# Patient Record
Sex: Male | Born: 1985 | Race: Black or African American | Hispanic: No | Marital: Single | Smoking: Never smoker
Health system: Southern US, Community
[De-identification: ages and names within clinical notes are randomized; demographics above are authoritative.]

---

## 2006-05-26 ENCOUNTER — Emergency Department (HOSPITAL_COMMUNITY): Admission: EM | Admit: 2006-05-26 | Discharge: 2006-05-26 | Payer: Self-pay | Admitting: Emergency Medicine

## 2006-06-08 ENCOUNTER — Emergency Department (HOSPITAL_COMMUNITY): Admission: EM | Admit: 2006-06-08 | Discharge: 2006-06-08 | Payer: Self-pay | Admitting: Emergency Medicine

## 2006-07-19 ENCOUNTER — Emergency Department (HOSPITAL_COMMUNITY): Admission: EM | Admit: 2006-07-19 | Discharge: 2006-07-19 | Payer: Self-pay | Admitting: Emergency Medicine

## 2006-07-21 ENCOUNTER — Inpatient Hospital Stay (HOSPITAL_COMMUNITY): Admission: EM | Admit: 2006-07-21 | Discharge: 2006-07-26 | Payer: Self-pay | Admitting: Emergency Medicine

## 2006-08-04 ENCOUNTER — Encounter: Admission: RE | Admit: 2006-08-04 | Discharge: 2006-08-04 | Payer: Self-pay | Admitting: Neurosurgery

## 2007-01-03 ENCOUNTER — Encounter: Admission: RE | Admit: 2007-01-03 | Discharge: 2007-01-03 | Payer: Self-pay | Admitting: Neurosurgery

## 2007-01-09 ENCOUNTER — Encounter: Admission: RE | Admit: 2007-01-09 | Discharge: 2007-01-09 | Payer: Self-pay | Admitting: Neurosurgery

## 2007-04-19 ENCOUNTER — Emergency Department (HOSPITAL_COMMUNITY): Admission: EM | Admit: 2007-04-19 | Discharge: 2007-04-19 | Payer: Self-pay | Admitting: Emergency Medicine

## 2008-04-10 ENCOUNTER — Emergency Department (HOSPITAL_COMMUNITY): Admission: EM | Admit: 2008-04-10 | Discharge: 2008-04-11 | Payer: Self-pay | Admitting: Emergency Medicine

## 2008-09-27 ENCOUNTER — Emergency Department (HOSPITAL_COMMUNITY): Admission: EM | Admit: 2008-09-27 | Discharge: 2008-09-27 | Payer: Self-pay | Admitting: Emergency Medicine

## 2009-02-15 IMAGING — CR DG ANKLE COMPLETE 3+V*R*
3 series · 3 of 3 positions shown · non-contrast
Comparison: none

CLINICAL DATA: Right ankle pain and swelling.  Injured right ankle running 2 weeks ago.  Swollen ankle. 
 RIGHT ANKLE - 3 VIEW:
 Mild soft tissue swelling.  No fracture or subluxation.

[t ankle joint ap right]
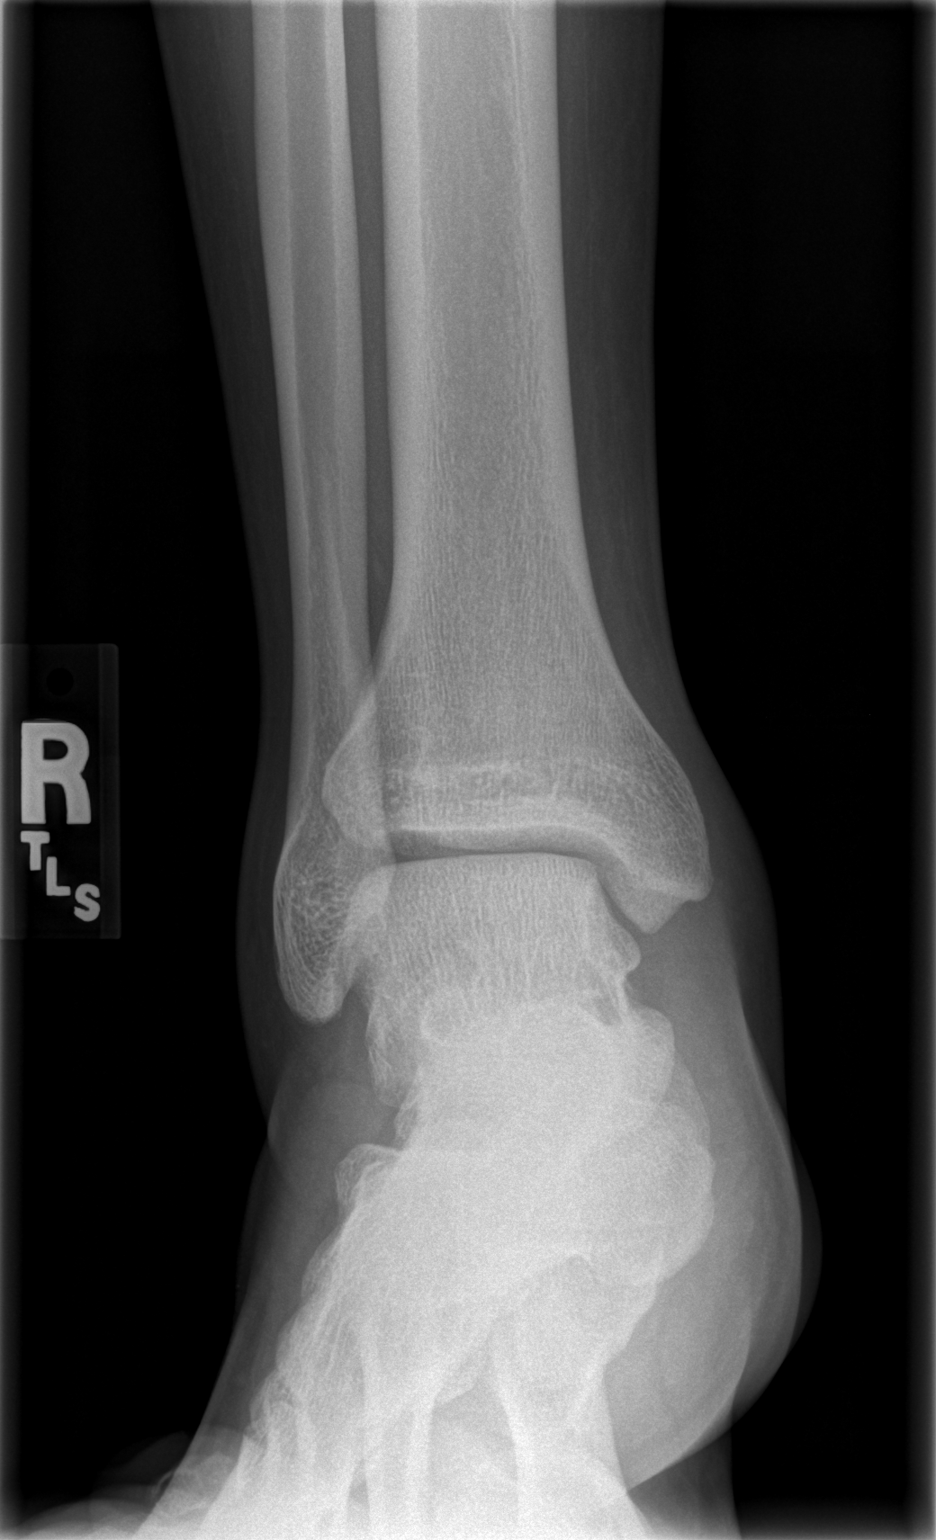

[t ankle joint oblique right]
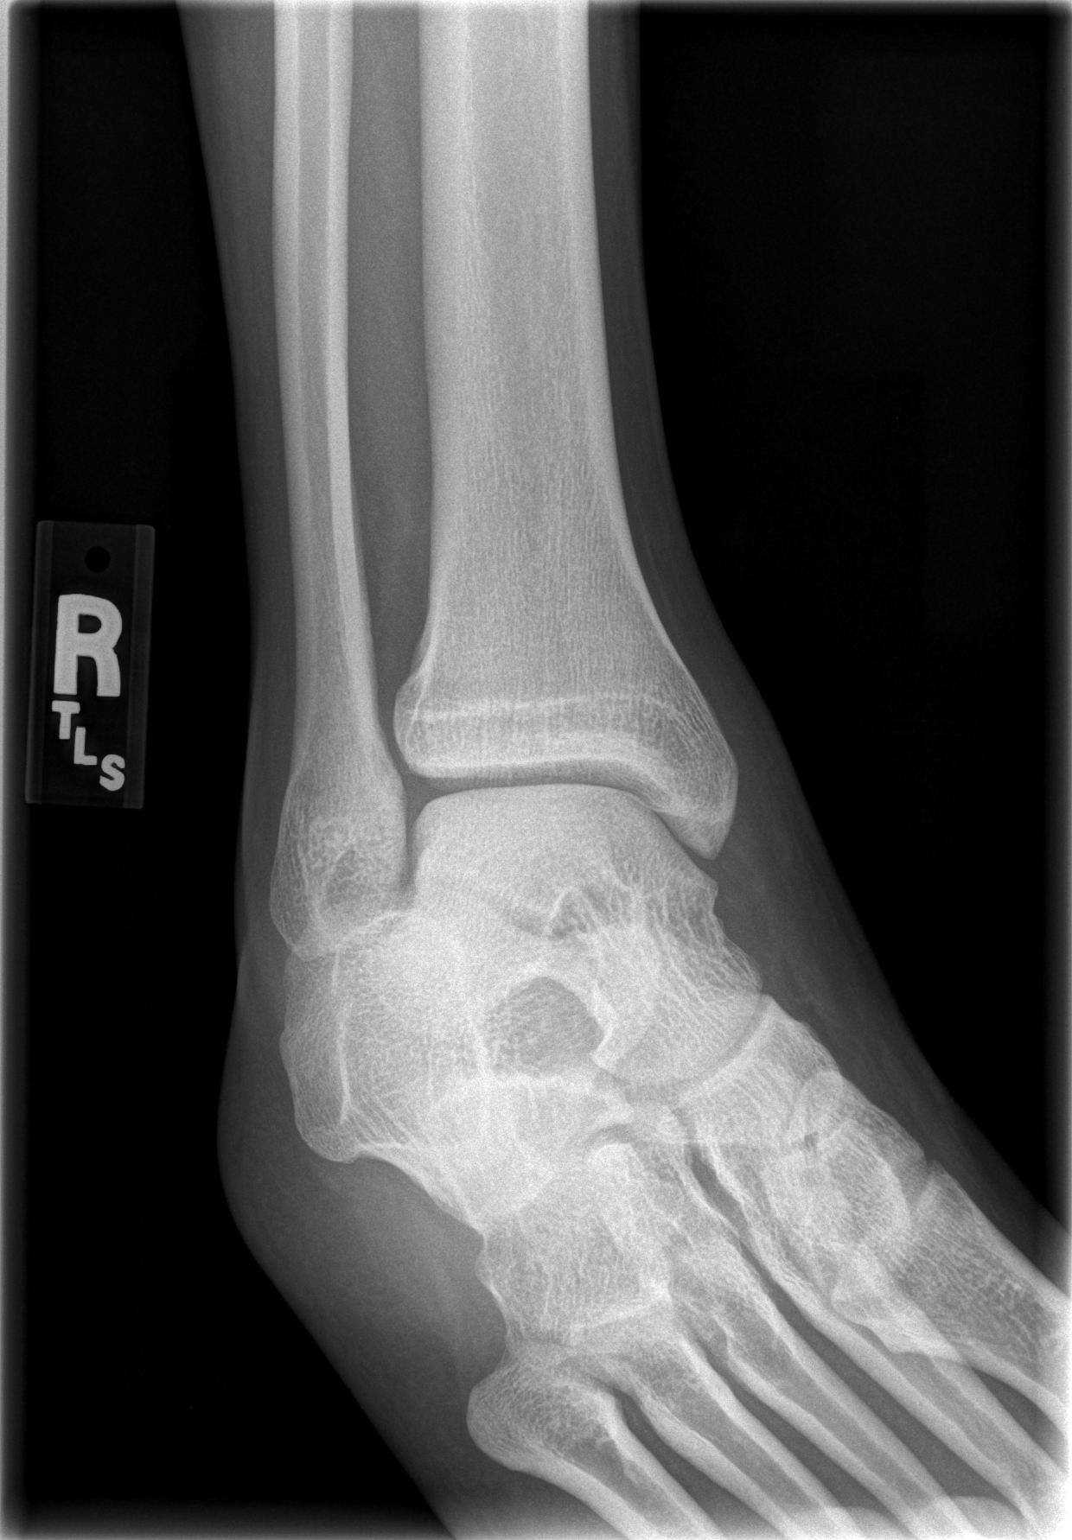

[t ankle joint lat right]
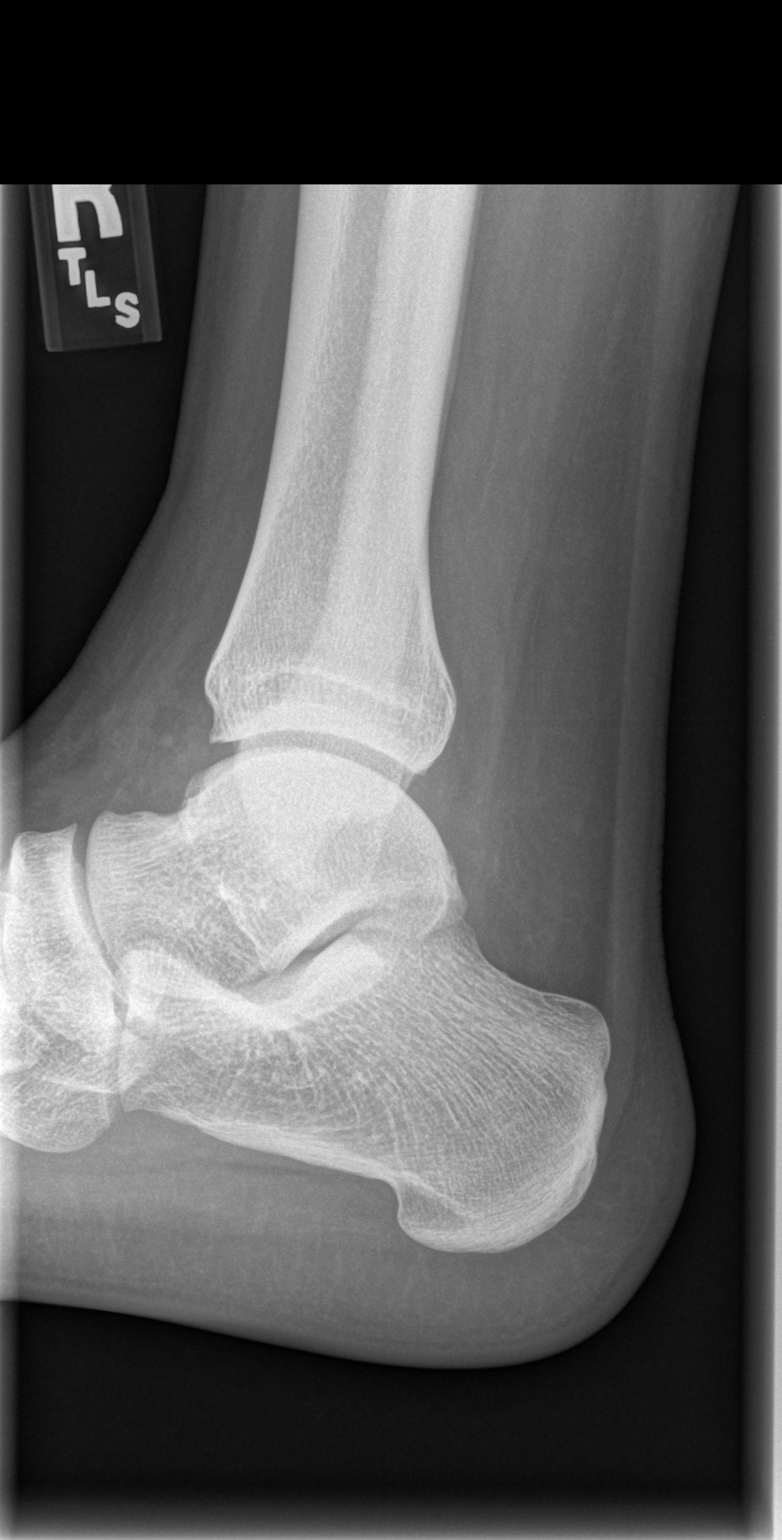

[3 of 3 positions shown; findings below may reference images not displayed]

IMPRESSION: No acute bony abnormality.

## 2009-11-10 ENCOUNTER — Emergency Department (HOSPITAL_COMMUNITY): Admission: EM | Admit: 2009-11-10 | Discharge: 2009-11-10 | Payer: Self-pay | Admitting: Emergency Medicine

## 2010-04-18 ENCOUNTER — Encounter: Payer: Self-pay | Admitting: Neurosurgery

## 2010-05-17 ENCOUNTER — Emergency Department (HOSPITAL_COMMUNITY)
Admission: EM | Admit: 2010-05-17 | Discharge: 2010-05-17 | Disposition: A | Discharge status: Home / Self Care | Payer: BC Managed Care – HMO | Attending: Emergency Medicine | Admitting: Emergency Medicine

## 2010-05-17 DIAGNOSIS — IMO0001 Reserved for inherently not codable concepts without codable children: Secondary | ICD-10-CM | POA: Insufficient documentation

## 2010-05-17 DIAGNOSIS — X500XXA Overexertion from strenuous movement or load, initial encounter: Secondary | ICD-10-CM | POA: Insufficient documentation

## 2010-05-17 DIAGNOSIS — J45909 Unspecified asthma, uncomplicated: Secondary | ICD-10-CM | POA: Insufficient documentation

## 2010-05-17 DIAGNOSIS — R109 Unspecified abdominal pain: Secondary | ICD-10-CM | POA: Insufficient documentation

## 2010-05-26 ENCOUNTER — Emergency Department (HOSPITAL_COMMUNITY)
Admission: EM | Admit: 2010-05-26 | Discharge: 2010-05-26 | Disposition: A | Discharge status: Home / Self Care | Payer: BC Managed Care – HMO | Attending: Emergency Medicine | Admitting: Emergency Medicine

## 2010-05-26 DIAGNOSIS — J45909 Unspecified asthma, uncomplicated: Secondary | ICD-10-CM | POA: Insufficient documentation

## 2010-05-26 DIAGNOSIS — R109 Unspecified abdominal pain: Secondary | ICD-10-CM | POA: Insufficient documentation

## 2010-05-26 DIAGNOSIS — Z87828 Personal history of other (healed) physical injury and trauma: Secondary | ICD-10-CM | POA: Insufficient documentation

## 2010-06-11 LAB — POCT I-STAT, CHEM 8
Chloride: 107 mEq/L (ref 96–112)
Creatinine, Ser: 1.3 mg/dL (ref 0.4–1.5)
Glucose, Bld: 151 mg/dL — ABNORMAL HIGH (ref 70–99)
HCT: 49 % (ref 39.0–52.0)
TCO2: 26 mmol/L (ref 0–100)

## 2010-06-11 LAB — URINALYSIS, ROUTINE W REFLEX MICROSCOPIC
Hgb urine dipstick: NEGATIVE
Ketones, ur: NEGATIVE mg/dL
Protein, ur: NEGATIVE mg/dL
Urobilinogen, UA: 0.2 mg/dL (ref 0.0–1.0)

## 2010-07-04 LAB — DIFFERENTIAL
Basophils Relative: 0 % (ref 0–1)
Eosinophils Relative: 0 % (ref 0–5)
Lymphs Abs: 0.5 10*3/uL — ABNORMAL LOW (ref 0.7–4.0)
Monocytes Absolute: 0.2 10*3/uL (ref 0.1–1.0)
Monocytes Relative: 1 % — ABNORMAL LOW (ref 3–12)
Neutrophils Relative %: 95 % — ABNORMAL HIGH (ref 43–77)

## 2010-07-04 LAB — COMPREHENSIVE METABOLIC PANEL
CO2: 24 mEq/L (ref 19–32)
Creatinine, Ser: 0.93 mg/dL (ref 0.4–1.5)
GFR calc Af Amer: >60 mL/min (ref >60)
Potassium: 3.4 mEq/L — ABNORMAL LOW (ref 3.5–5.1)
Sodium: 143 mEq/L (ref 135–145)
Total Protein: 6.9 g/dL (ref 6.0–8.3)

## 2010-07-04 LAB — CBC
Hemoglobin: 14.4 g/dL (ref 13.0–17.0)
Platelets: 249 10*3/uL (ref 150–400)
RBC: 4.65 MIL/uL (ref 4.22–5.81)
RDW: 13.5 % (ref 11.5–15.5)

## 2010-07-12 LAB — CBC
HCT: 42 % (ref 39.0–52.0)
Hemoglobin: 14 g/dL (ref 13.0–17.0)
MCHC: 33.3 g/dL (ref 30.0–36.0)
MCV: 88.5 fL (ref 78.0–100.0)
RBC: 4.74 MIL/uL (ref 4.22–5.81)

## 2010-07-12 LAB — URINALYSIS, ROUTINE W REFLEX MICROSCOPIC
Bilirubin Urine: NEGATIVE
Glucose, UA: NEGATIVE mg/dL
Nitrite: NEGATIVE
Protein, ur: NEGATIVE mg/dL
pH: 6 (ref 5.0–8.0)

## 2010-07-12 LAB — COMPREHENSIVE METABOLIC PANEL
Albumin: 4.4 g/dL (ref 3.5–5.2)
BUN: 8 mg/dL (ref 6–23)
CO2: 23 mEq/L (ref 19–32)
Calcium: 9.6 mg/dL (ref 8.4–10.5)
Creatinine, Ser: 0.94 mg/dL (ref 0.4–1.5)
GFR calc Af Amer: >60 mL/min (ref >60)
Glucose, Bld: 113 mg/dL — ABNORMAL HIGH (ref 70–99)
Potassium: 3.5 mEq/L (ref 3.5–5.1)
Total Bilirubin: 0.9 mg/dL (ref 0.3–1.2)

## 2010-07-12 LAB — DIFFERENTIAL
Eosinophils Absolute: 0 10*3/uL (ref 0.0–0.7)
Monocytes Relative: 5 % (ref 3–12)
Neutrophils Relative %: 88 % — ABNORMAL HIGH (ref 43–77)

## 2010-07-12 LAB — LIPASE, BLOOD: Lipase: 27 U/L (ref 11–59)

## 2010-08-13 NOTE — Discharge Summary (Signed)
NAME:  James Bonilla, James Bonilla                 ACCOUNT NO.:  [id number]   MEDICAL RECORD NO.:  [id number]          PATIENT TYPE:  INP   LOCATION:  3003                         FACILITY:  [facility]   PHYSICIAN:  Henry A. Pool, M.D.    DATE OF BIRTH:  Mar 25, 1986   DATE OF ADMISSION:  07/21/2006  DATE OF DISCHARGE:  07/26/2006                               DISCHARGE SUMMARY   FINAL DIAGNOSIS:  Small bilateral posttraumatic subfrontal contusions.   HISTORY OF PRESENT ILLNESS:  Mr. Unrein is a 25 year old male who  suffered an apparent fall to his occipital region, and had resulted  headaches and some degree of mild confusion.  Workup demonstrates  evidence of bilateral subfrontal contusions consistent with a contrecoup  injury.  The patient was admitted for observation.   HOSPITAL COURSE:  The patient was admitted to the hospital and observed  in the intensive care unit.  The patient's neurological exam remained  stable.  He had some persistent headaches, some occasional nausea and  vomiting, and some disequilibrium.  These symptoms gradually subsided  over time.  He had no difficulties with seizures or other evidence of  brain injury.  Followup head CT scan demonstrated a stable appearance of  the subfrontal contusion.  Eventually, the patient's clinical course  stabilized to the point where he could be discharged home.  At the time  of discharge, the patient is ambulating without difficulty, he is  tolerating a regular diet.  He has had some mild headaches, but no other  neurological symptoms.  He will follow up in my office in one week.   CONDITION ON DISCHARGE:  Improved.           ______________________________  Kathaleen Maser Pool, M.D.     HAP/MEDQ  D:  10/17/2006  T:  10/18/2006  Job:  [id number]

## 2010-12-16 LAB — URINE MICROSCOPIC-ADD ON

## 2010-12-16 LAB — COMPREHENSIVE METABOLIC PANEL
Alkaline Phosphatase: 59
BUN: 9
Chloride: 102
Glucose, Bld: 134 — ABNORMAL HIGH
Potassium: 3.4 — ABNORMAL LOW
Total Bilirubin: 0.8
Total Protein: 7.9

## 2010-12-16 LAB — URINALYSIS, ROUTINE W REFLEX MICROSCOPIC
Leukocytes, UA: NEGATIVE
Protein, ur: 100 — AB
Urobilinogen, UA: 0.2

## 2010-12-16 LAB — DIFFERENTIAL
Basophils Absolute: 0
Basophils Relative: 0
Neutro Abs: 15.2 — ABNORMAL HIGH
Neutrophils Relative %: 92 — ABNORMAL HIGH

## 2010-12-16 LAB — CBC
MCV: 86.3
Platelets: 278
RBC: 5.27

## 2010-12-16 LAB — LIPASE, BLOOD: Lipase: 14

## 2011-06-07 IMAGING — CT CT ABDOMEN W/ CM
2 of 4 series · 17 of 46 positions shown, 19 images · IV contrast (APPLIED)
Comparison: None.

CT ABDOMEN

CLINICAL DATA: Right lower quadrant and midabdominal pain 12 hours
with nausea.

CT ABDOMEN AND PELVIS WITH CONTRAST
TECHNIQUE: Multidetector CT imaging of the abdomen and pelvis was
performed using the standard protocol following bolus
administration of intravenous contrast.
Contrast: 100 ml [id number].

[Series 2: abd/pelv with 5.0 b31f st · axial · 0.67mm/px · z∈[-499,-89]mm · 14 of 90 slices shown, 16 images]
[im 4/90  soft-tissue]
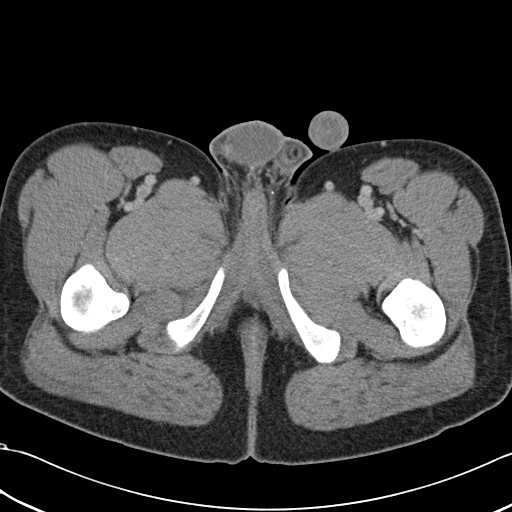
[im 4/90  bone]
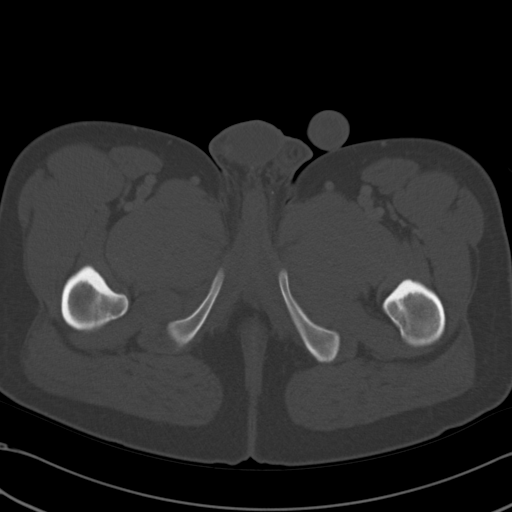
[im 11/90  soft-tissue]
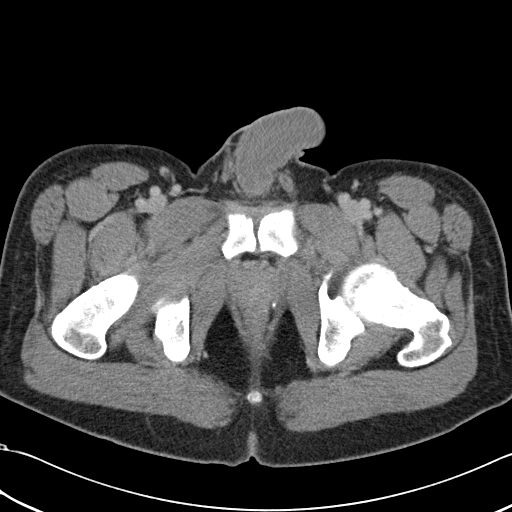
[im 18/90  soft-tissue]
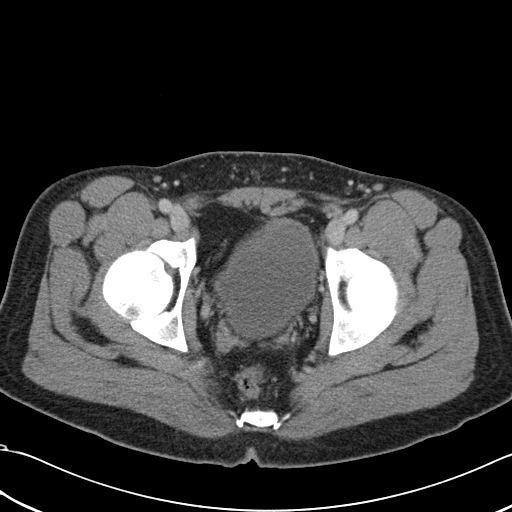
[im 25/90  soft-tissue]
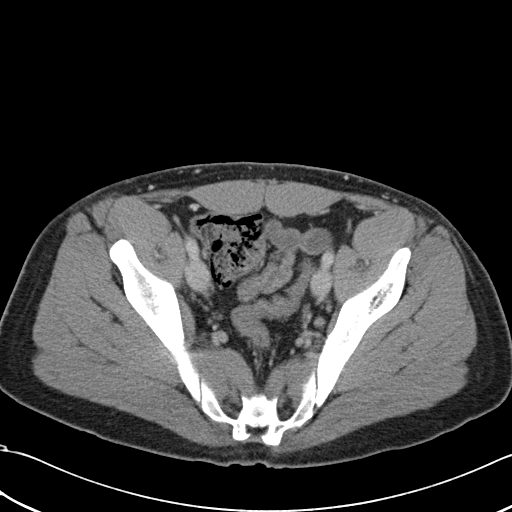
[im 29/90  soft-tissue]
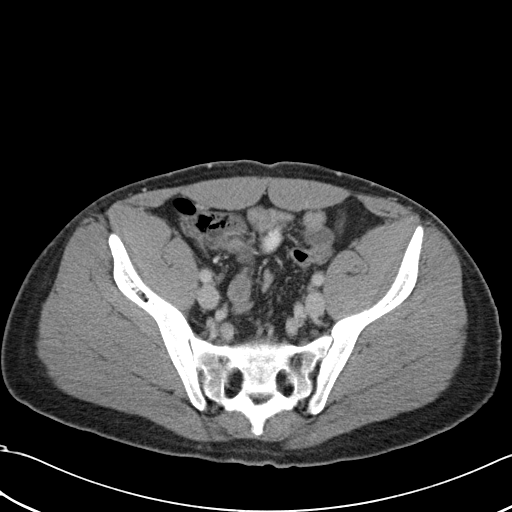
[im 36/90  soft-tissue]
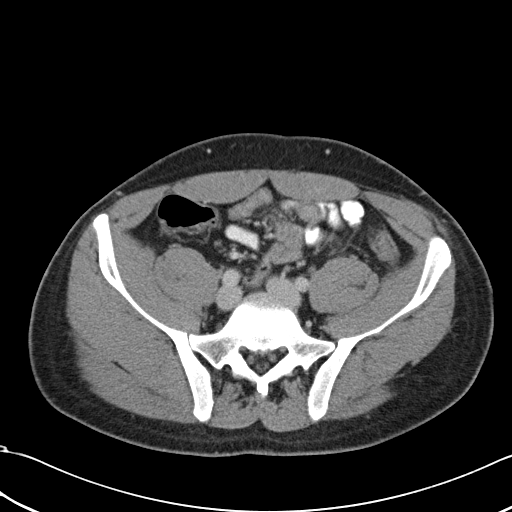
[im 43/90  soft-tissue]
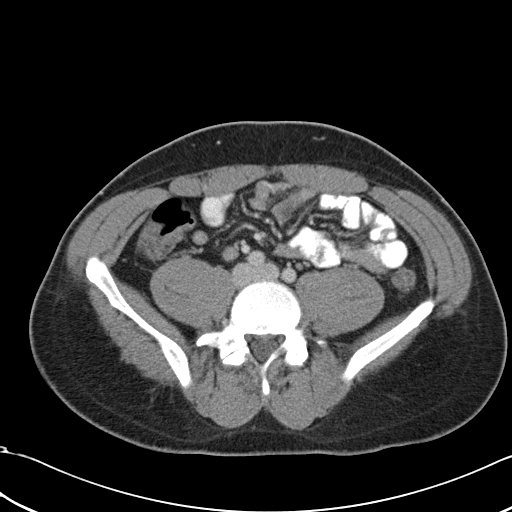
[im 47/90  soft-tissue]
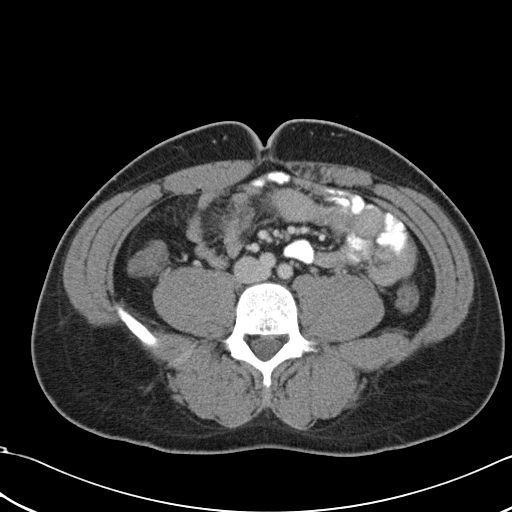
[im 54/90  soft-tissue]
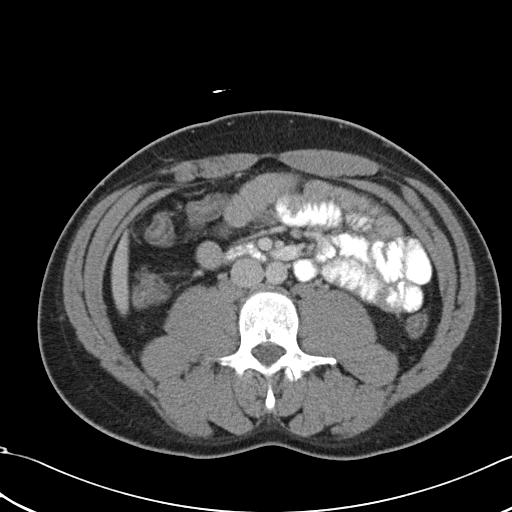
[im 54/90  bone]
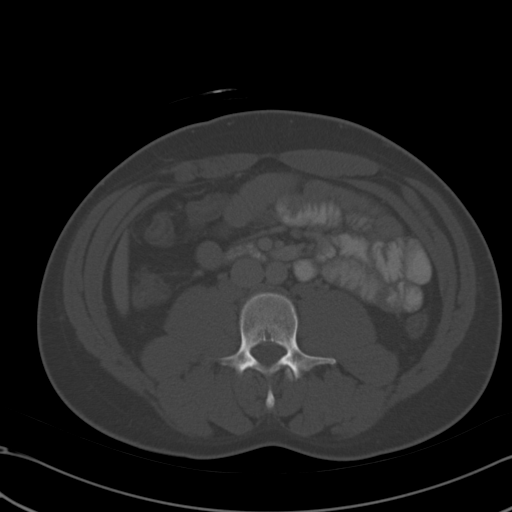
[im 61/90  soft-tissue]
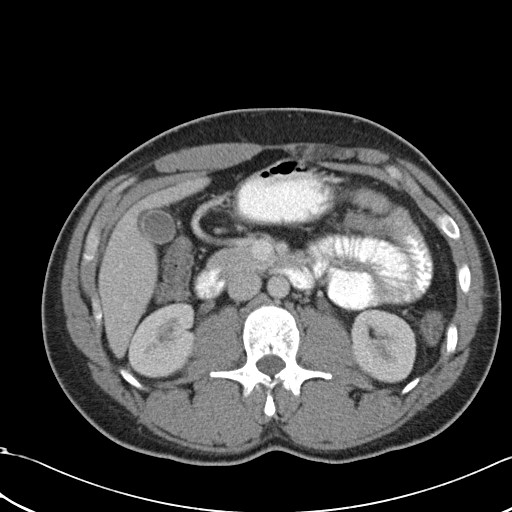
[im 68/90  soft-tissue]
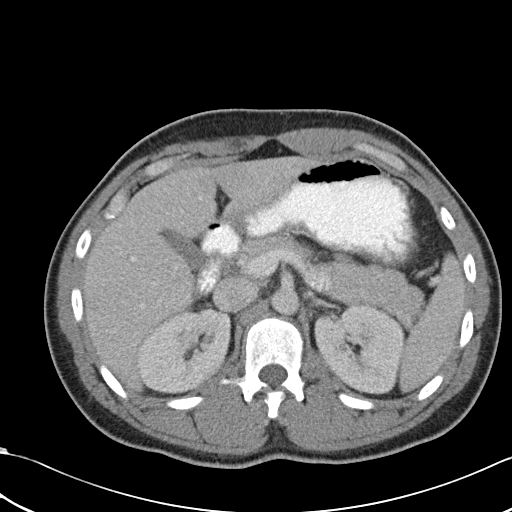
[im 72/90  soft-tissue]
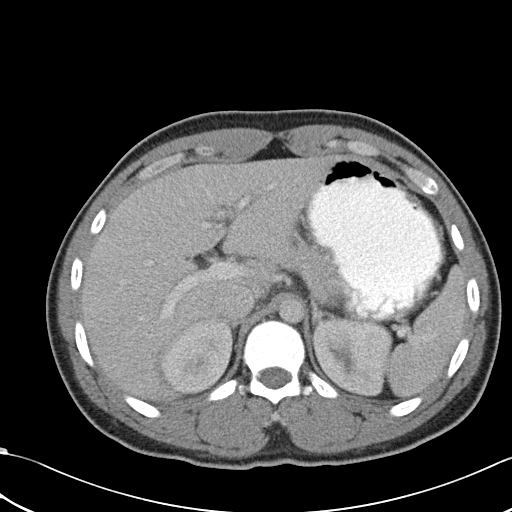
[im 79/90  soft-tissue]
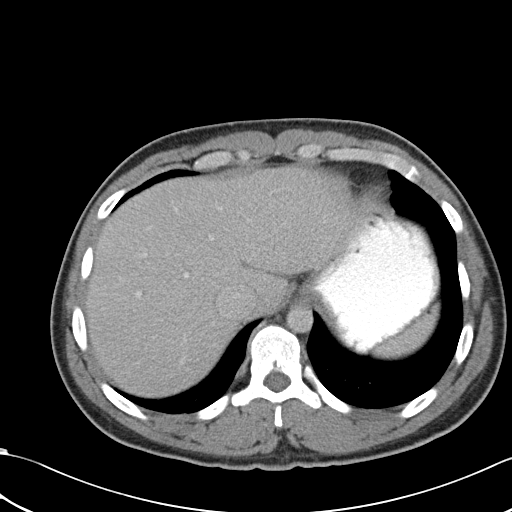
[im 86/90  soft-tissue]
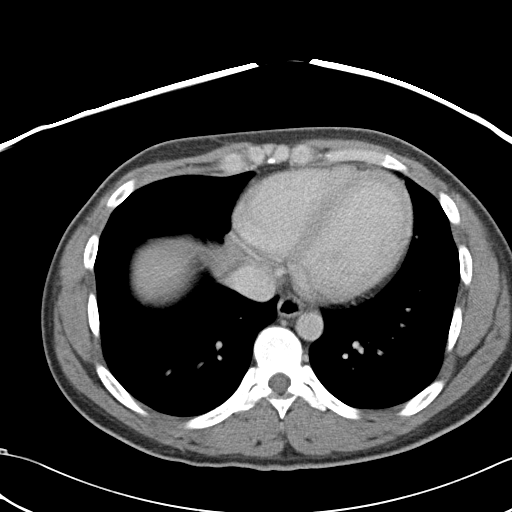

[Series 602: cor a/p · coronal · 0.87mm/px · 3 of 113 slices shown]
[im 38/113  soft-tissue]
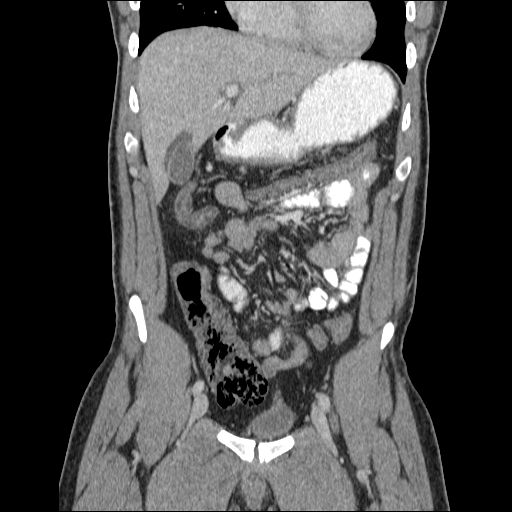
[im 50/113  soft-tissue]
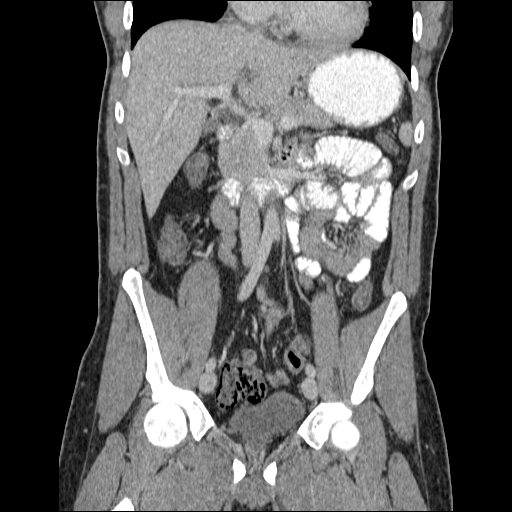
[im 63/113  soft-tissue]
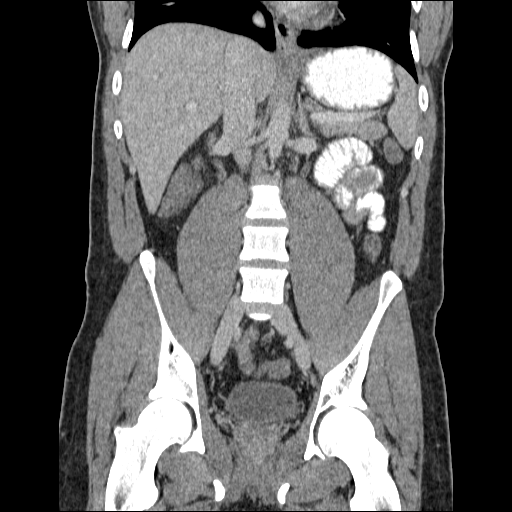

[17 of 46 positions shown; findings below may reference images not displayed]

FINDINGS: Small amount of fluid surrounds the gallbladder.  Mild
intrahepatic bile duct dilation.  No discrete calcified gallstone
or common bile duct stent.  Findings raise possibility of
cholecystitis.

Fluid filled dilated proximal small bowel may be related to mild
ileus or the fact the patient ingested contrast just before the
exam.

Lung bases clear.  No focal liver, renal, adrenal, splenic or
pancreatic lesion.  No abdominal aortic aneurysm.
IMPRESSION: Fluid surrounds gallbladder and is mild in hepatic biliary duct
prominence.  Question cholecystitis.

CT PELVIS
FINDINGS: No inflammation surrounds the appendix.  No free fluid
in the pelvis. Majority of the colon is under distended.
Transitional vertebra labeled S1 with pars defect on the right at
the L5 level.
IMPRESSION: No inflammation surrounds the appendix.

Pars defect on the right at the L5 level.  Transitional S1.

Critical test results telephoned to Dr. Pol at the time of
interpretation on 09/27/2008 at [DATE] p.m.

## 2013-06-11 ENCOUNTER — Emergency Department (HOSPITAL_COMMUNITY)
Admission: EM | Admit: 2013-06-11 | Discharge: 2013-06-11 | Discharge status: Against Medical Advice | Payer: BC Managed Care – HMO | Attending: Emergency Medicine | Admitting: Emergency Medicine

## 2013-06-11 ENCOUNTER — Encounter (HOSPITAL_COMMUNITY): Payer: Self-pay | Admitting: Emergency Medicine

## 2013-06-11 DIAGNOSIS — R1110 Vomiting, unspecified: Secondary | ICD-10-CM | POA: Insufficient documentation

## 2013-06-11 DIAGNOSIS — R1084 Generalized abdominal pain: Secondary | ICD-10-CM | POA: Insufficient documentation

## 2013-06-11 LAB — CBC WITH DIFFERENTIAL/PLATELET
Basophils Absolute: 0 10*3/uL (ref 0.0–0.1)
Basophils Relative: 0 % (ref 0–1)
EOS PCT: 0 % (ref 0–5)
Eosinophils Absolute: 0 10*3/uL (ref 0.0–0.7)
HEMATOCRIT: 41.2 % (ref 39.0–52.0)
HEMOGLOBIN: 14.5 g/dL (ref 13.0–17.0)
LYMPHS PCT: 6 % — AB (ref 12–46)
Lymphs Abs: 0.9 10*3/uL (ref 0.7–4.0)
MCH: 29.2 pg (ref 26.0–34.0)
MCHC: 35.2 g/dL (ref 30.0–36.0)
MCV: 82.9 fL (ref 78.0–100.0)
MONO ABS: 0.7 10*3/uL (ref 0.1–1.0)
MONOS PCT: 5 % (ref 3–12)
NEUTROS ABS: 13.6 10*3/uL — AB (ref 1.7–7.7)
Neutrophils Relative %: 89 % — ABNORMAL HIGH (ref 43–77)
Platelets: 247 10*3/uL (ref 150–400)
RBC: 4.97 MIL/uL (ref 4.22–5.81)
RDW: 13.2 % (ref 11.5–15.5)
WBC: 15.2 10*3/uL — AB (ref 4.0–10.5)

## 2013-06-11 LAB — COMPREHENSIVE METABOLIC PANEL
ALBUMIN: 4 g/dL (ref 3.5–5.2)
ALT: 15 U/L (ref 0–53)
AST: 18 U/L (ref 0–37)
Alkaline Phosphatase: 56 U/L (ref 39–117)
BUN: 10 mg/dL (ref 6–23)
CALCIUM: 9.6 mg/dL (ref 8.4–10.5)
CO2: 21 mEq/L (ref 19–32)
Chloride: 103 mEq/L (ref 96–112)
Creatinine, Ser: 0.98 mg/dL (ref 0.50–1.35)
GFR calc non Af Amer: >90 mL/min (ref >90)
GLUCOSE: 156 mg/dL — AB (ref 70–99)
POTASSIUM: 3.7 meq/L (ref 3.7–5.3)
SODIUM: 143 meq/L (ref 137–147)
TOTAL PROTEIN: 7.4 g/dL (ref 6.0–8.3)
Total Bilirubin: 0.3 mg/dL (ref 0.3–1.2)

## 2013-06-11 MED ORDER — ONDANSETRON 4 MG PO TBDP
8.0000 mg | ORAL_TABLET | Freq: Once | ORAL | Status: AC
  Administered 2013-06-11: 8 mg via ORAL
  Filled 2013-06-11: qty 2

## 2013-06-11 NOTE — ED Notes (Signed)
Pt reports that he woke up with generalized abdominal pain and vomiting since 9am. Mild amount of diarrhea. Pt reports he feels weak and shaky.
